# Patient Record
Sex: Female | Born: 1985 | Race: Black or African American | Hispanic: No | Marital: Single | Smoking: Current every day smoker
Health system: Southern US, Community
[De-identification: ages and names within clinical notes are randomized; demographics above are authoritative.]

## PROBLEM LIST (undated history)

## (undated) DIAGNOSIS — I10 Essential (primary) hypertension: Secondary | ICD-10-CM

---

## 2017-06-17 ENCOUNTER — Encounter: Payer: Self-pay | Admitting: Emergency Medicine

## 2017-06-17 ENCOUNTER — Other Ambulatory Visit: Payer: Self-pay

## 2017-06-17 ENCOUNTER — Emergency Department: Payer: Self-pay

## 2017-06-17 ENCOUNTER — Emergency Department
Admission: EM | Admit: 2017-06-17 | Discharge: 2017-06-17 | Disposition: A | Discharge status: Home / Self Care | Payer: Self-pay | Attending: Emergency Medicine | Admitting: Emergency Medicine

## 2017-06-17 DIAGNOSIS — Y999 Unspecified external cause status: Secondary | ICD-10-CM | POA: Insufficient documentation

## 2017-06-17 DIAGNOSIS — F17210 Nicotine dependence, cigarettes, uncomplicated: Secondary | ICD-10-CM | POA: Insufficient documentation

## 2017-06-17 DIAGNOSIS — S62343A Nondisplaced fracture of base of third metacarpal bone, left hand, initial encounter for closed fracture: Secondary | ICD-10-CM | POA: Insufficient documentation

## 2017-06-17 DIAGNOSIS — W231XXA Caught, crushed, jammed, or pinched between stationary objects, initial encounter: Secondary | ICD-10-CM | POA: Insufficient documentation

## 2017-06-17 DIAGNOSIS — Y939 Activity, unspecified: Secondary | ICD-10-CM | POA: Insufficient documentation

## 2017-06-17 DIAGNOSIS — Y929 Unspecified place or not applicable: Secondary | ICD-10-CM | POA: Insufficient documentation

## 2017-06-17 DIAGNOSIS — I10 Essential (primary) hypertension: Secondary | ICD-10-CM | POA: Insufficient documentation

## 2017-06-17 HISTORY — DX: Essential (primary) hypertension: I10

## 2017-06-17 MED ORDER — NAPROXEN 500 MG PO TABS
500.0000 mg | ORAL_TABLET | Freq: Once | ORAL | Status: AC
  Administered 2017-06-17: 500 mg via ORAL
  Filled 2017-06-17: qty 1

## 2017-06-17 MED ORDER — NAPROXEN 500 MG PO TABS: 500.0000 mg | ORAL_TABLET | Freq: Two times a day (BID) | ORAL | Status: AC

## 2017-06-17 MED ORDER — OXYCODONE-ACETAMINOPHEN 5-325 MG PO TABS: 1.0000 | ORAL_TABLET | ORAL | 0 refills | Status: AC | PRN

## 2017-06-17 NOTE — ED Triage Notes (Signed)
Pt presents to ED via POV c/o L hand after moving an old heavy TV and dropped it on her hand last night. Swelling noted to back of L hand. Pt able to move all fingers and wrist.

## 2017-06-17 NOTE — ED Provider Notes (Signed)
[facility] Emergency Department Provider Note   ____________________________________________   First MD Initiated Contact with Patient 06/17/17 1738     (approximate)  I have reviewed the triage vital signs and the nursing notes.   HISTORY  Chief Complaint Hand Injury    HPI Desiree Alvarado is a 31 y.o. female patient complaining of right hand pain secondary to contusion. Patient states she was moving a heavy TV and dry on her hand last night. Patient presents with swelling to the back of the hand. Patient denies loss of function or loss of sensation. Patient is right-hand dominant. Patient rates pain as a 10 over 10. Patient described a pain as "throbbing". Ice pack was applied in triage.  Past Medical History:  Diagnosis Date  . Hypertension     There are no active problems to display for this patient.   History reviewed. No pertinent surgical history.  Prior to Admission medications   Medication Sig Start Date End Date Taking? Authorizing Provider  naproxen (NAPROSYN) 500 MG tablet Take 1 tablet (500 mg total) by mouth 2 (two) times daily with a meal. 06/17/17   Joni ReiningSmith, Ineta Sinning K, PA-C  oxyCODONE-acetaminophen (ROXICET) 5-325 MG tablet Take 1 tablet by mouth every 4 (four) hours as needed for severe pain. 06/17/17   Joni ReiningSmith, Arva Slaugh K, PA-C    Allergies Patient has no known allergies.  History reviewed. No pertinent family history.  Social History Social History   Tobacco Use  . Smoking status: Current Every Day Smoker    Packs/day: 1.00    Types: Cigarettes  . Smokeless tobacco: Never Used  Substance Use Topics  . Alcohol use: Yes    Frequency: Never    Comment: occasionally  . Drug use: Yes    Types: Marijuana    Review of Systems  Constitutional: No fever/chills Eyes: No visual changes. ENT: No sore throat. Cardiovascular: Denies chest pain. Respiratory: Denies shortness of breath. Gastrointestinal: No abdominal pain.  No  nausea, no vomiting.  No diarrhea.  No constipation. Genitourinary: Negative for dysuria. Musculoskeletal: Left hand pain . Skin: Negative for rash. Neurological: Negative for headaches, focal weakness or numbness.   ____________________________________________   PHYSICAL EXAM:  VITAL SIGNS: ED Triage Vitals  Enc Vitals Group     BP 06/17/17 1655 (!) 186/115     Pulse Rate 06/17/17 1655 74     Resp 06/17/17 1655 18     Temp 06/17/17 1655 98.9 F (37.2 C)     Temp Source 06/17/17 1655 Oral     SpO2 06/17/17 1655 99 %     Weight 06/17/17 1655 160 lb (72.6 kg)     Height 06/17/17 1655 5\' 6"  (1.676 m)     Head Circumference --      Peak Flow --      Pain Score 06/17/17 1659 10     Pain Loc --      Pain Edu? --      Excl. in GC? --    Constitutional: Alert and oriented. Well appearing and in no acute distress. Anxious Cardiovascular: Normal rate, regular rhythm. Grossly normal heart sounds.  Good peripheral circulation. Elevated blood pressure Respiratory: Normal respiratory effort.  No retractions. Lungs CTAB. Gastrointestinal: Soft and nontender. No distention. No abdominal bruits. No CVA tenderness. Musculoskeletal: No obvious deformity to the left hand. Moderate edema to the dose aspect of the left hand. Neurologic:  Normal speech and language. No gross focal neurologic deficits are appreciated. No gait instability. Skin:  Skin is warm, dry and intact. No rash noted. Psychiatric: Mood and affect are normal. Speech and behavior are normal.  ____________________________________________   LABS (all labs ordered are listed, but only abnormal results are displayed)  Labs Reviewed - No data to display ____________________________________________  EKG   ____________________________________________  RADIOLOGY  Dg Hand Complete Left  Result Date: 06/17/2017 CLINICAL DATA:  Pain after trauma EXAM: LEFT HAND - COMPLETE 3+ VIEW COMPARISON:  None. FINDINGS: There is a  spiral fracture through the third metacarpal diaphysis. Mild irregularity of the proximal second metacarpal is identified. No other evidence of fracture. IMPRESSION: 1. There is a spiral fracture through the diaphysis of the third metacarpal. 2. Mild irregularity an sclerosis in the proximal second metacarpal may be sequela of prior trauma. Recommend clinical correlation to exclude point tenderness in this location. Electronically Signed   By: Gerome Samavid  Williams III M.D   On: 06/17/2017 17:38    ____________________________________________   PROCEDURES  Procedure(s) performed: None  Procedures  Critical Care performed: No  ____________________________________________   INITIAL IMPRESSION / ASSESSMENT AND PLAN / ED COURSE  As part of my medical decision making, I reviewed the following data within the electronic MEDICAL RECORD NUMBER    Pain secondary to a spiral fracture of the third metacarpal. Discussed x-ray finding with patient. Patient discharged with instructions to place in the volar splint. Status post application of splint by, examination reveals patient remains neurovascularly intact. Patient will follow orthopedics clinic by calling for an appointment tomorrow morning.      ____________________________________________   FINAL CLINICAL IMPRESSION(S) / ED DIAGNOSES  Final diagnoses:  Nondisplaced fracture of base of third metacarpal bone, left hand, initial encounter for closed fracture     ED Discharge Orders        Ordered    naproxen (NAPROSYN) 500 MG tablet  2 times daily with meals     06/17/17 1757    oxyCODONE-acetaminophen (ROXICET) 5-325 MG tablet  Every 4 hours PRN     06/17/17 1757       Note:  This document was prepared using Dragon voice recognition software and may include unintentional dictation errors.    Joni ReiningSmith, Lada Fulbright K, PA-C 06/17/17 1811    Arnaldo NatalMalinda, Paul F, MD 06/17/17 2008

## 2017-06-17 NOTE — ED Notes (Signed)
See triage note presents with pain to left hand   States developed pain after having something drop on hand

## 2017-06-17 NOTE — ED Notes (Signed)
FIRST NURSE NOTE:  Pt states she was moving a tv and slipped, injuring left hand. Swelling noted to hand.

## 2019-06-25 IMAGING — CR DG HAND COMPLETE 3+V*L*
3 series · 3 of 3 positions shown · non-contrast
Comparison: None.

CLINICAL DATA: Pain after trauma

EXAM:
LEFT HAND - COMPLETE 3+ VIEW

[hand ap]
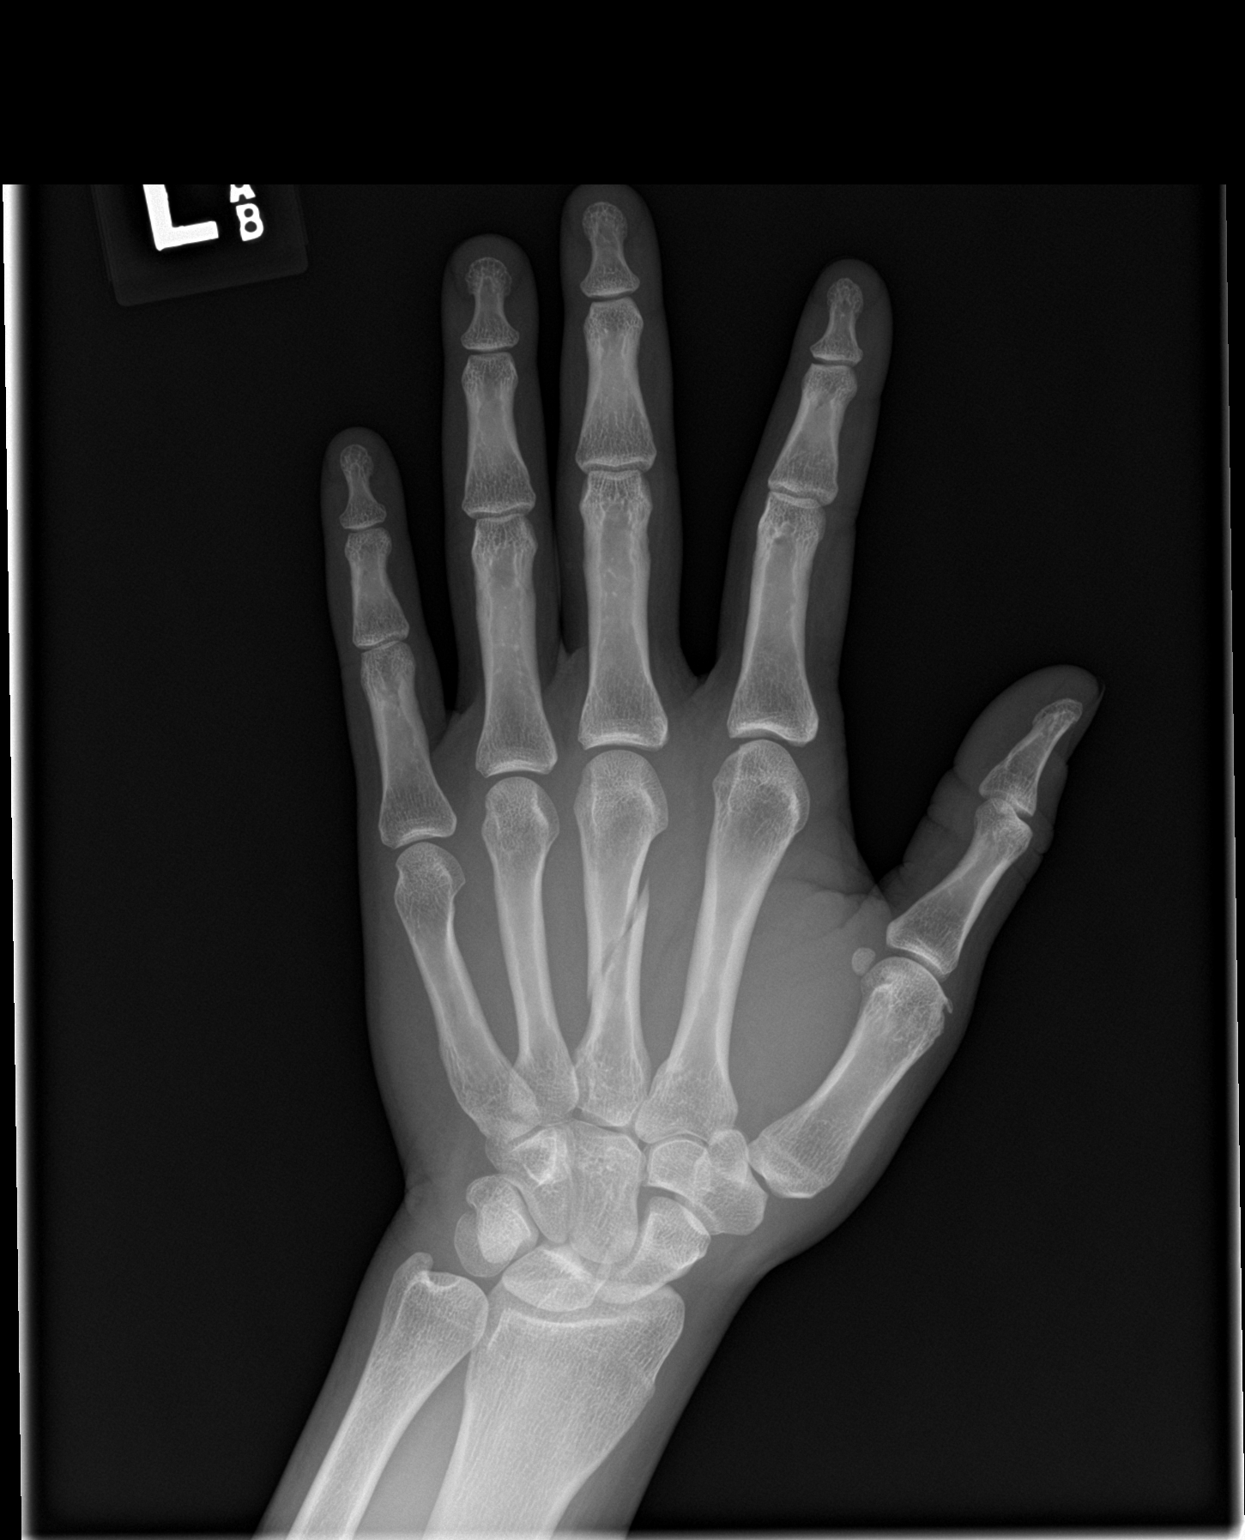

[hand obl]
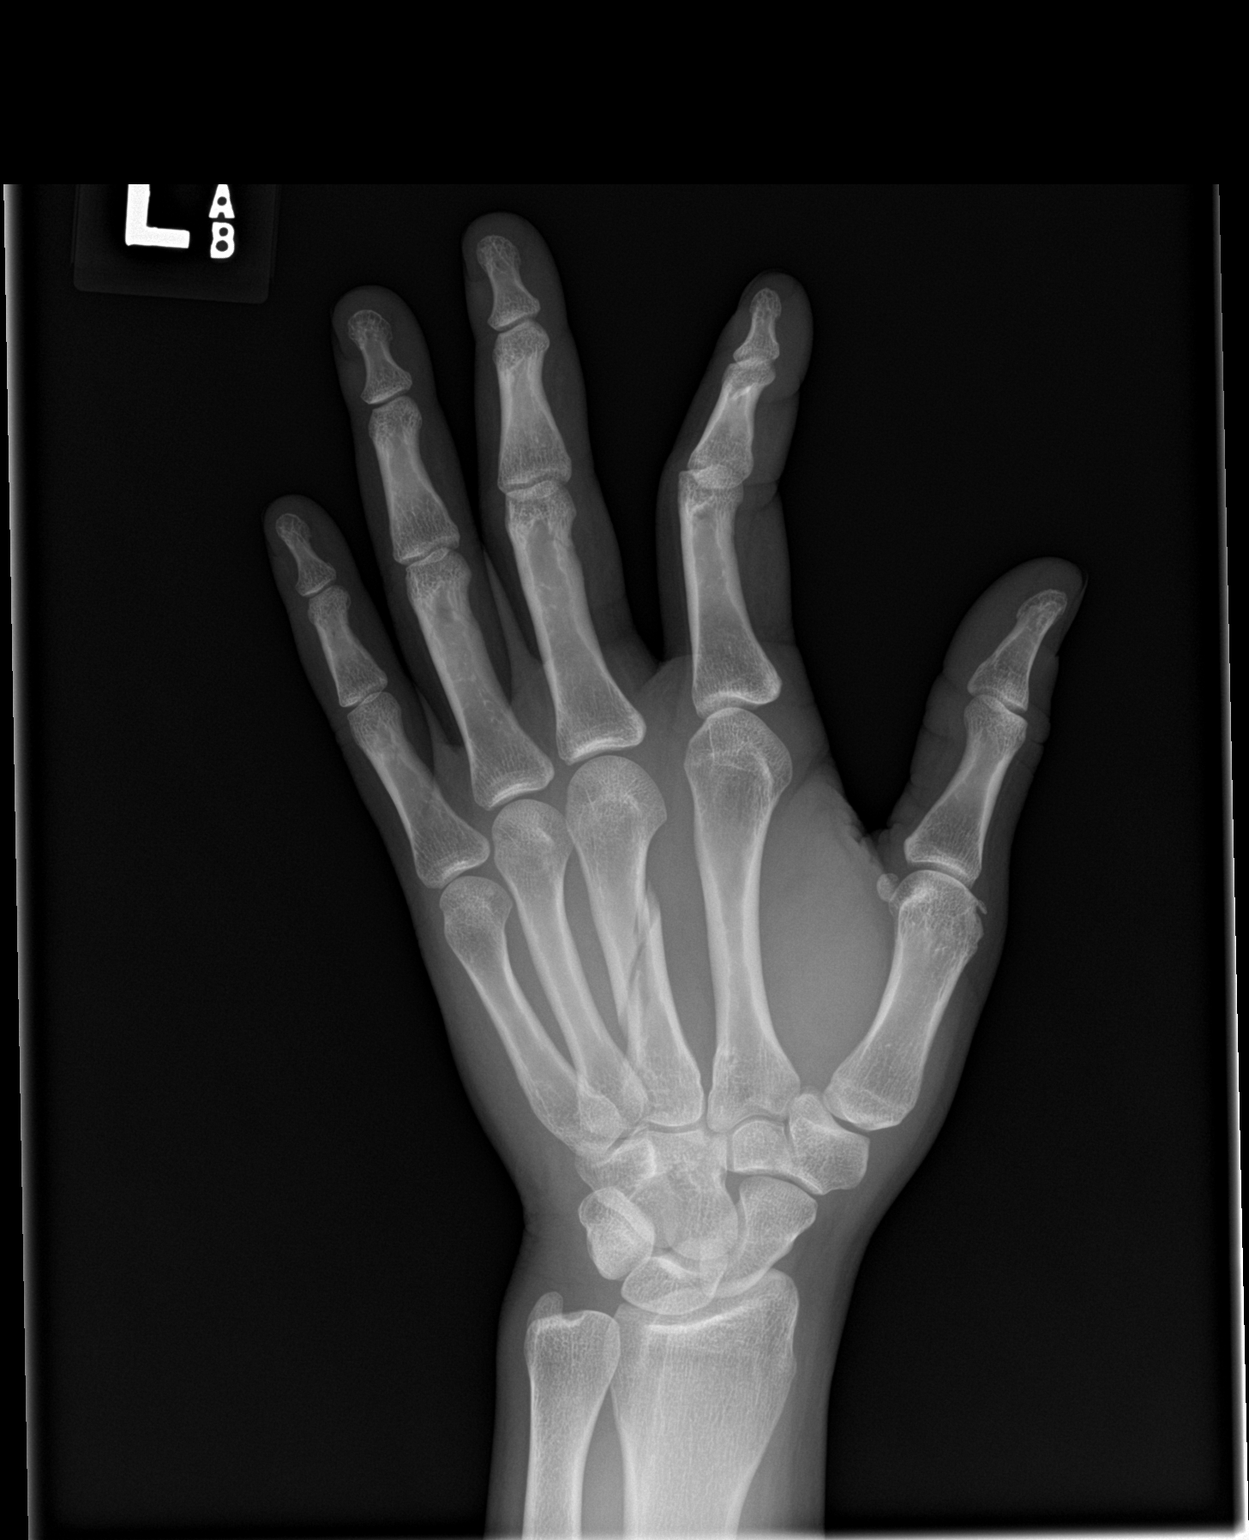

[hand lat]
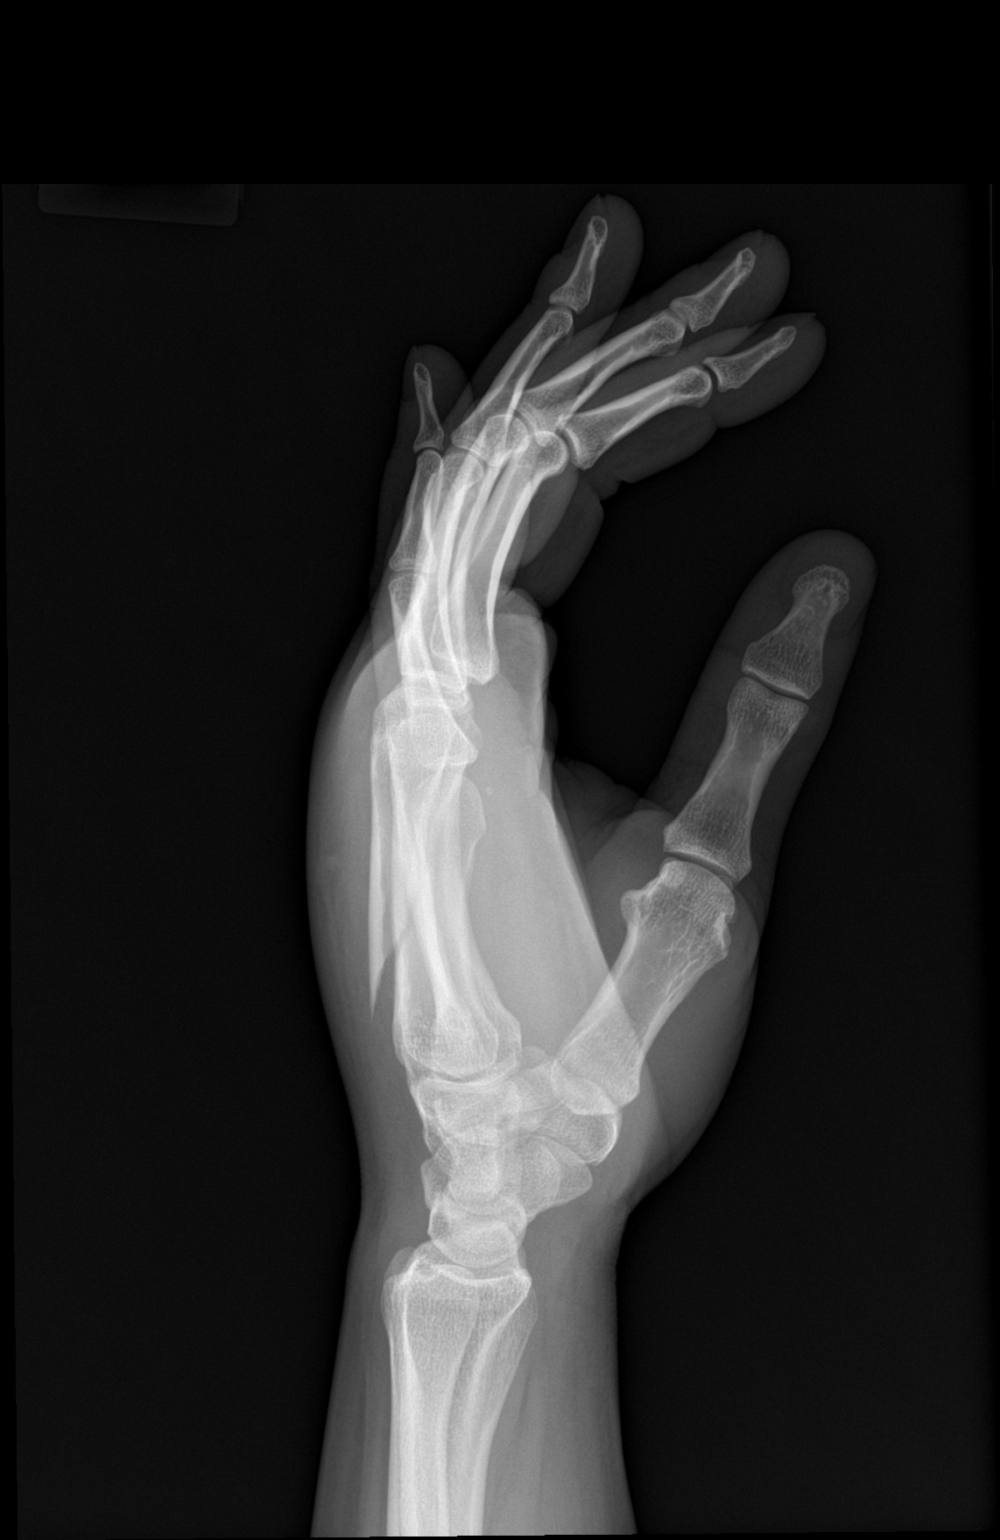

[3 of 3 positions shown; findings below may reference images not displayed]

FINDINGS: There is a spiral fracture through the third metacarpal diaphysis.
Mild irregularity of the proximal second metacarpal is identified.
No other evidence of fracture.
IMPRESSION: 1. There is a spiral fracture through the diaphysis of the third
metacarpal.
2. Mild irregularity an sclerosis in the proximal second metacarpal
may be sequela of prior trauma. Recommend clinical correlation to
exclude point tenderness in this location.
